# Patient Record
Sex: Male | Born: 1935 | Race: White | Hispanic: No | Smoking: Never smoker
Health system: Southern US, Community
[De-identification: ages and names within clinical notes are randomized; demographics above are authoritative.]

## PROBLEM LIST (undated history)

## (undated) HISTORY — PX: CARDIAC SURGERY: SHX584

---

## 2015-09-14 ENCOUNTER — Emergency Department (HOSPITAL_COMMUNITY)
Admission: EM | Admit: 2015-09-14 | Discharge: 2015-09-14 | Disposition: A | Discharge status: Home / Self Care | Payer: No Typology Code available for payment source | Attending: Emergency Medicine | Admitting: Emergency Medicine

## 2015-09-14 ENCOUNTER — Emergency Department (HOSPITAL_COMMUNITY): Payer: No Typology Code available for payment source

## 2015-09-14 ENCOUNTER — Encounter (HOSPITAL_COMMUNITY): Payer: Self-pay

## 2015-09-14 DIAGNOSIS — R079 Chest pain, unspecified: Secondary | ICD-10-CM | POA: Diagnosis not present

## 2015-09-14 DIAGNOSIS — R0789 Other chest pain: Secondary | ICD-10-CM

## 2015-09-14 DIAGNOSIS — S29001A Unspecified injury of muscle and tendon of front wall of thorax, initial encounter: Secondary | ICD-10-CM | POA: Diagnosis present

## 2015-09-14 DIAGNOSIS — R930 Abnormal findings on diagnostic imaging of skull and head, not elsewhere classified: Secondary | ICD-10-CM | POA: Insufficient documentation

## 2015-09-14 DIAGNOSIS — Y92410 Unspecified street and highway as the place of occurrence of the external cause: Secondary | ICD-10-CM | POA: Insufficient documentation

## 2015-09-14 DIAGNOSIS — S20211A Contusion of right front wall of thorax, initial encounter: Secondary | ICD-10-CM | POA: Diagnosis not present

## 2015-09-14 DIAGNOSIS — Z79899 Other long term (current) drug therapy: Secondary | ICD-10-CM | POA: Diagnosis not present

## 2015-09-14 DIAGNOSIS — I10 Essential (primary) hypertension: Secondary | ICD-10-CM | POA: Diagnosis not present

## 2015-09-14 DIAGNOSIS — Y9389 Activity, other specified: Secondary | ICD-10-CM | POA: Insufficient documentation

## 2015-09-14 DIAGNOSIS — I2510 Atherosclerotic heart disease of native coronary artery without angina pectoris: Secondary | ICD-10-CM | POA: Diagnosis not present

## 2015-09-14 DIAGNOSIS — Z8639 Personal history of other endocrine, nutritional and metabolic disease: Secondary | ICD-10-CM | POA: Insufficient documentation

## 2015-09-14 DIAGNOSIS — Y998 Other external cause status: Secondary | ICD-10-CM | POA: Insufficient documentation

## 2015-09-14 DIAGNOSIS — Z951 Presence of aortocoronary bypass graft: Secondary | ICD-10-CM | POA: Diagnosis not present

## 2015-09-14 LAB — CBC WITH DIFFERENTIAL/PLATELET
BASOS PCT: 0 %
Basophils Absolute: 0 10*3/uL (ref 0.0–0.1)
EOS ABS: 0.1 10*3/uL (ref 0.0–0.7)
Eosinophils Relative: 2 %
HCT: 37.7 % — ABNORMAL LOW (ref 39.0–52.0)
HEMOGLOBIN: 12.3 g/dL — AB (ref 13.0–17.0)
Lymphocytes Relative: 22 %
Lymphs Abs: 1.8 10*3/uL (ref 0.7–4.0)
MCH: 30.4 pg (ref 26.0–34.0)
MCHC: 32.6 g/dL (ref 30.0–36.0)
MCV: 93.3 fL (ref 78.0–100.0)
Monocytes Absolute: 0.7 10*3/uL (ref 0.1–1.0)
Monocytes Relative: 9 %
NEUTROS PCT: 67 %
Neutro Abs: 5.5 10*3/uL (ref 1.7–7.7)
PLATELETS: 207 10*3/uL (ref 150–400)
RBC: 4.04 MIL/uL — AB (ref 4.22–5.81)
RDW: 13.5 % (ref 11.5–15.5)
WBC: 8.1 10*3/uL (ref 4.0–10.5)

## 2015-09-14 LAB — BASIC METABOLIC PANEL
Anion gap: 13 (ref 5–15)
BUN: 25 mg/dL — ABNORMAL HIGH (ref 6–20)
CHLORIDE: 105 mmol/L (ref 101–111)
CO2: 24 mmol/L (ref 22–32)
CREATININE: 1.66 mg/dL — AB (ref 0.61–1.24)
Calcium: 9.7 mg/dL (ref 8.9–10.3)
GFR, EST AFRICAN AMERICAN: 41 mL/min — AB (ref >60)
GFR, EST NON AFRICAN AMERICAN: 35 mL/min — AB (ref >60)
Glucose, Bld: 111 mg/dL — ABNORMAL HIGH (ref 65–99)
POTASSIUM: 4 mmol/L (ref 3.5–5.1)
SODIUM: 142 mmol/L (ref 135–145)

## 2015-09-14 LAB — PROTIME-INR
INR: 1.04 (ref 0.00–1.49)
PROTHROMBIN TIME: 13.8 s (ref 11.6–15.2)

## 2015-09-14 LAB — APTT: aPTT: 32 seconds (ref 24–37)

## 2015-09-14 LAB — I-STAT TROPONIN, ED: Troponin i, poc: 0.01 ng/mL (ref 0.00–0.08)

## 2015-09-14 MED ORDER — ACETAMINOPHEN 500 MG PO TABS
1000.0000 mg | ORAL_TABLET | Freq: Once | ORAL | Status: AC
  Administered 2015-09-14: 1000 mg via ORAL
  Filled 2015-09-14: qty 2

## 2015-09-14 MED ORDER — FENTANYL CITRATE (PF) 100 MCG/2ML IJ SOLN
50.0000 ug | Freq: Once | INTRAMUSCULAR | Status: DC
  Filled 2015-09-14: qty 2
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
## 2015-09-14 NOTE — ED Notes (Signed)
Pt stable, ambulatory, states understanding of discharge instructions 

## 2015-09-14 NOTE — Discharge Instructions (Signed)
Your kidney function was a little elevated today (Creatinine 1.66). Please follow-up with your PCP about this when [place]. We don't know if this is new or old.  Your CT head does not show injury but you do have a pituitary mass/lesion. You will need to see your PCP and potentially have MRI to have further evaluation of this.  Return for worsening symptoms, including fever, difficulty breathing, worsening pain, or any other symptoms concerning to you.  Chest Contusion A contusion is a deep bruise. Bruises happen when an injury causes bleeding under the skin. Signs of bruising include pain, puffiness (swelling), and discolored skin. The bruise may turn blue, purple, or yellow.  HOME CARE  Put ice on the injured area.  Put ice in a plastic bag.  Place a towel between the skin and the bag.  Leave the ice on for 15-20 minutes at a time, 03-04 times a day for the first 48 hours.  Only take medicine as told by your doctor.  Rest.  Take deep breaths (deep-breathing exercises) as told by your doctor.  Stop smoking if you smoke.  Do not lift objects over 5 pounds (2.3 kilograms) for 3 days or longer if told by your doctor. GET HELP RIGHT AWAY IF:   You have more bruising or puffiness.  You have pain that gets worse.  You have trouble breathing.  You are dizzy, weak, or pass out (faint).  You have blood in your pee (urine) or poop (stool).  You cough up or throw up (vomit) blood.  Your puffiness or pain is not helped with medicines. MAKE SURE YOU:   Understand these instructions.  Will watch your condition.  Will get help right away if you are not doing well or get worse.   This information is not intended to replace advice given to you by your health care provider. Make sure you discuss any questions you have with your health care provider.   Document Released: 10/02/2007 Document Revised: 01/08/2012 Document Reviewed: 10/07/2011 Elsevier Interactive Patient  Education [employer]2016 [employer]

## 2015-09-14 NOTE — ED Notes (Signed)
Respiratory called for spirometry teaching

## 2015-09-14 NOTE — ED Notes (Signed)
Called respiratory again for incentive spirometer
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
## 2015-09-14 NOTE — ED Notes (Signed)
Pt states he doesn't want pain medicine at this time
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
